# Patient Record
Sex: Male | Born: 1965 | Race: White | Hispanic: No | Marital: Single | Smoking: Current every day smoker
Health system: Southern US, Community
[De-identification: ages and names within clinical notes are randomized; demographics above are authoritative.]

## PROBLEM LIST (undated history)

## (undated) DIAGNOSIS — R569 Unspecified convulsions: Secondary | ICD-10-CM

## (undated) DIAGNOSIS — I639 Cerebral infarction, unspecified: Secondary | ICD-10-CM

## (undated) DIAGNOSIS — I219 Acute myocardial infarction, unspecified: Secondary | ICD-10-CM

## (undated) HISTORY — PX: LUNG LOBECTOMY: SHX167

---

## 1997-12-21 ENCOUNTER — Emergency Department (HOSPITAL_COMMUNITY): Admission: EM | Admit: 1997-12-21 | Discharge: 1997-12-22 | Payer: Self-pay | Admitting: Emergency Medicine

## 2002-11-27 ENCOUNTER — Emergency Department (HOSPITAL_COMMUNITY): Admission: EM | Admit: 2002-11-27 | Discharge: 2002-11-27 | Payer: Self-pay | Admitting: *Deleted

## 2002-11-29 ENCOUNTER — Encounter: Payer: Self-pay | Admitting: Emergency Medicine

## 2002-11-29 ENCOUNTER — Emergency Department (HOSPITAL_COMMUNITY): Admission: EM | Admit: 2002-11-29 | Discharge: 2002-11-29 | Payer: Self-pay | Admitting: Emergency Medicine

## 2004-06-11 ENCOUNTER — Emergency Department (HOSPITAL_COMMUNITY): Admission: EM | Admit: 2004-06-11 | Discharge: 2004-06-11 | Payer: Self-pay | Admitting: *Deleted

## 2007-10-12 ENCOUNTER — Emergency Department: Payer: Self-pay | Admitting: Emergency Medicine

## 2007-10-12 ENCOUNTER — Other Ambulatory Visit: Payer: Self-pay

## 2007-11-11 ENCOUNTER — Encounter: Payer: Self-pay | Admitting: Internal Medicine

## 2007-11-30 ENCOUNTER — Ambulatory Visit: Payer: Self-pay | Admitting: Internal Medicine

## 2007-11-30 DIAGNOSIS — Z8679 Personal history of other diseases of the circulatory system: Secondary | ICD-10-CM | POA: Insufficient documentation

## 2007-11-30 DIAGNOSIS — J984 Other disorders of lung: Secondary | ICD-10-CM

## 2007-11-30 DIAGNOSIS — E785 Hyperlipidemia, unspecified: Secondary | ICD-10-CM

## 2007-11-30 DIAGNOSIS — Z87442 Personal history of urinary calculi: Secondary | ICD-10-CM

## 2007-11-30 LAB — CONVERTED CEMR LAB
AST: 20 units/L (ref 0–37)
Cholesterol: 159 mg/dL (ref 0–200)

## 2007-12-09 ENCOUNTER — Encounter: Payer: Self-pay | Admitting: Internal Medicine

## 2007-12-10 ENCOUNTER — Encounter: Payer: Self-pay | Admitting: Family Medicine

## 2007-12-10 ENCOUNTER — Ambulatory Visit: Payer: Self-pay | Admitting: Thoracic Surgery (Cardiothoracic Vascular Surgery)

## 2007-12-10 ENCOUNTER — Encounter: Payer: Self-pay | Admitting: Internal Medicine

## 2007-12-24 ENCOUNTER — Ambulatory Visit: Payer: Self-pay | Admitting: Thoracic Surgery (Cardiothoracic Vascular Surgery)

## 2007-12-24 ENCOUNTER — Encounter: Payer: Self-pay | Admitting: Thoracic Surgery (Cardiothoracic Vascular Surgery)

## 2007-12-24 ENCOUNTER — Inpatient Hospital Stay (HOSPITAL_COMMUNITY)
Admission: RE | Admit: 2007-12-24 | Discharge: 2007-12-28 | Payer: Self-pay | Admitting: Thoracic Surgery (Cardiothoracic Vascular Surgery)

## 2008-01-11 ENCOUNTER — Encounter
Admission: RE | Admit: 2008-01-11 | Discharge: 2008-01-11 | Payer: Self-pay | Admitting: Thoracic Surgery (Cardiothoracic Vascular Surgery)

## 2008-01-11 ENCOUNTER — Ambulatory Visit: Payer: Self-pay | Admitting: Thoracic Surgery (Cardiothoracic Vascular Surgery)

## 2008-03-14 ENCOUNTER — Ambulatory Visit: Payer: Self-pay | Admitting: Internal Medicine

## 2008-03-14 DIAGNOSIS — B070 Plantar wart: Secondary | ICD-10-CM

## 2008-03-14 DIAGNOSIS — R569 Unspecified convulsions: Secondary | ICD-10-CM

## 2008-03-14 DIAGNOSIS — K219 Gastro-esophageal reflux disease without esophagitis: Secondary | ICD-10-CM | POA: Insufficient documentation

## 2008-03-14 DIAGNOSIS — F17200 Nicotine dependence, unspecified, uncomplicated: Secondary | ICD-10-CM

## 2008-03-14 LAB — CONVERTED CEMR LAB
AST: 19 units/L (ref 0–37)
Cholesterol: 146 mg/dL (ref 0–200)

## 2008-06-06 ENCOUNTER — Ambulatory Visit: Payer: Self-pay | Admitting: Internal Medicine

## 2008-06-06 DIAGNOSIS — F411 Generalized anxiety disorder: Secondary | ICD-10-CM | POA: Insufficient documentation

## 2008-10-03 ENCOUNTER — Ambulatory Visit: Payer: Self-pay | Admitting: Internal Medicine

## 2008-11-21 IMAGING — CR DG CHEST 2V
2 series · 2 of 2 positions shown · non-contrast
Comparison: None

CLINICAL DATA: Preoperative respiratory exam.  Mediastinal mass.

CHEST - 2 VIEW

[view not recorded (1 of 2)]
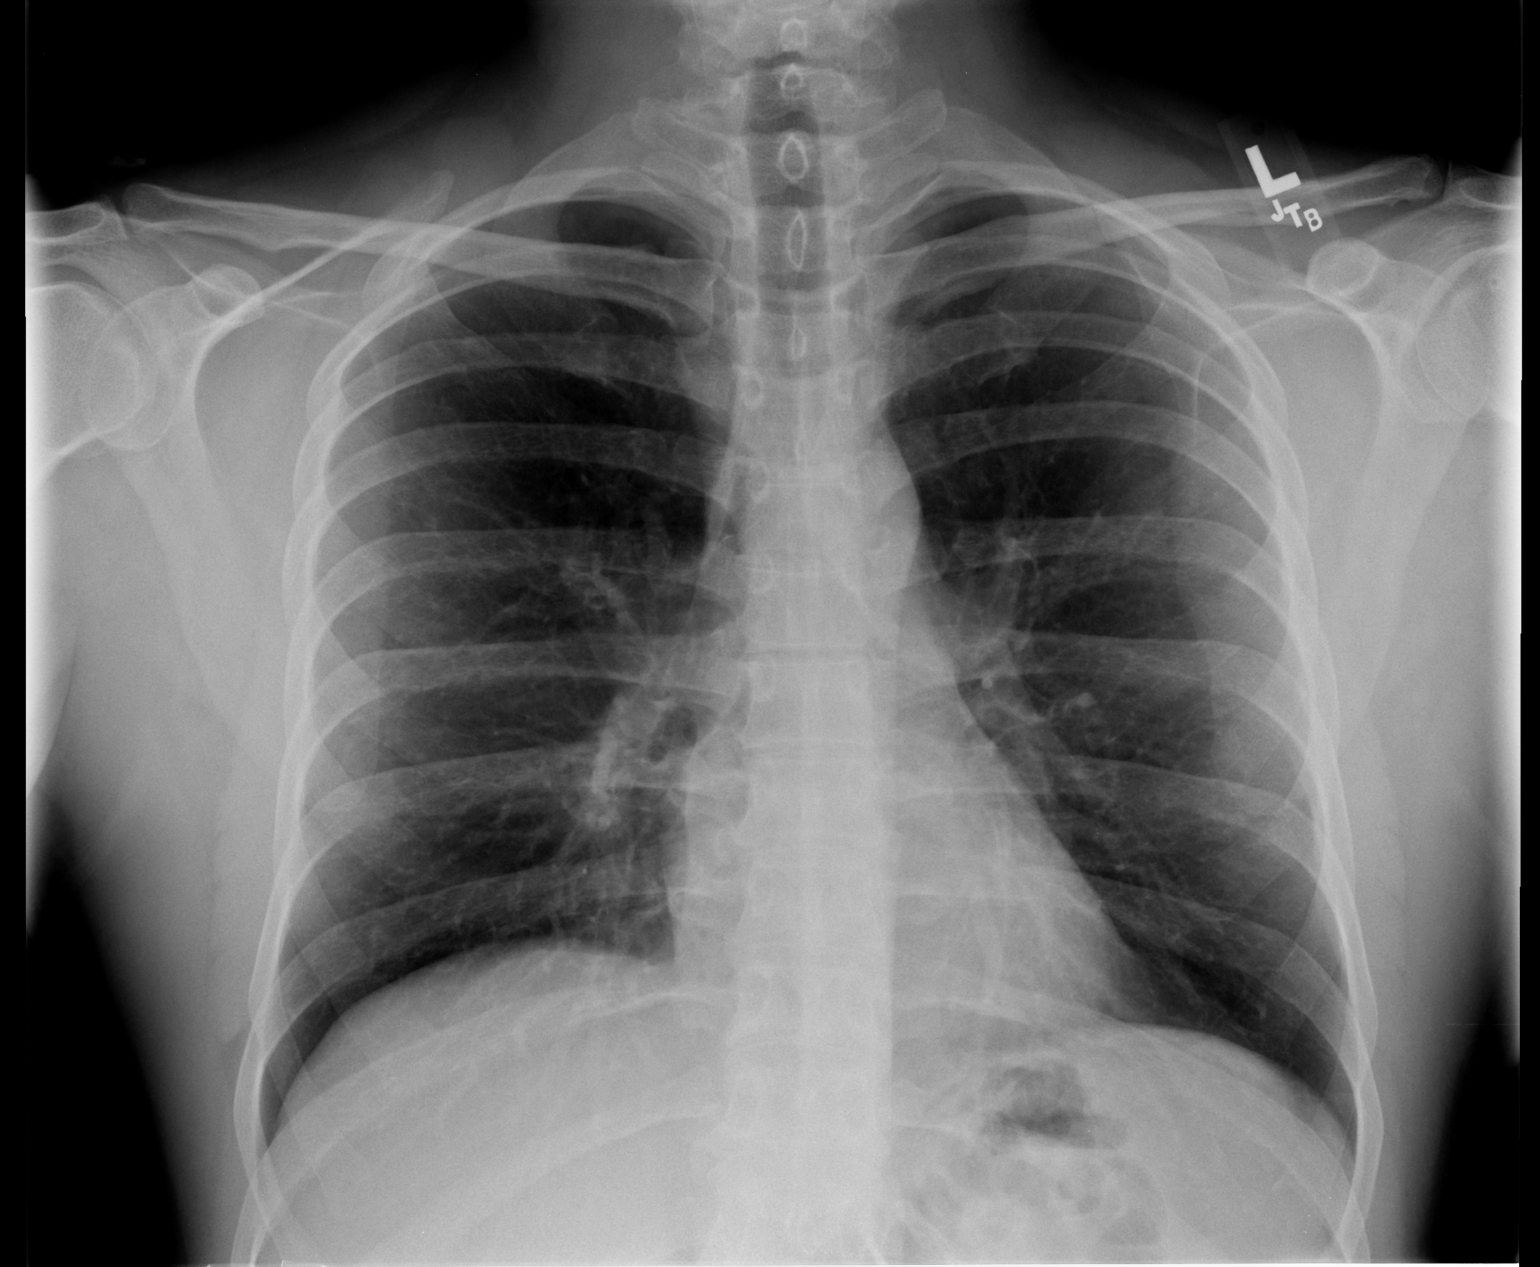

[view not recorded (2 of 2)]
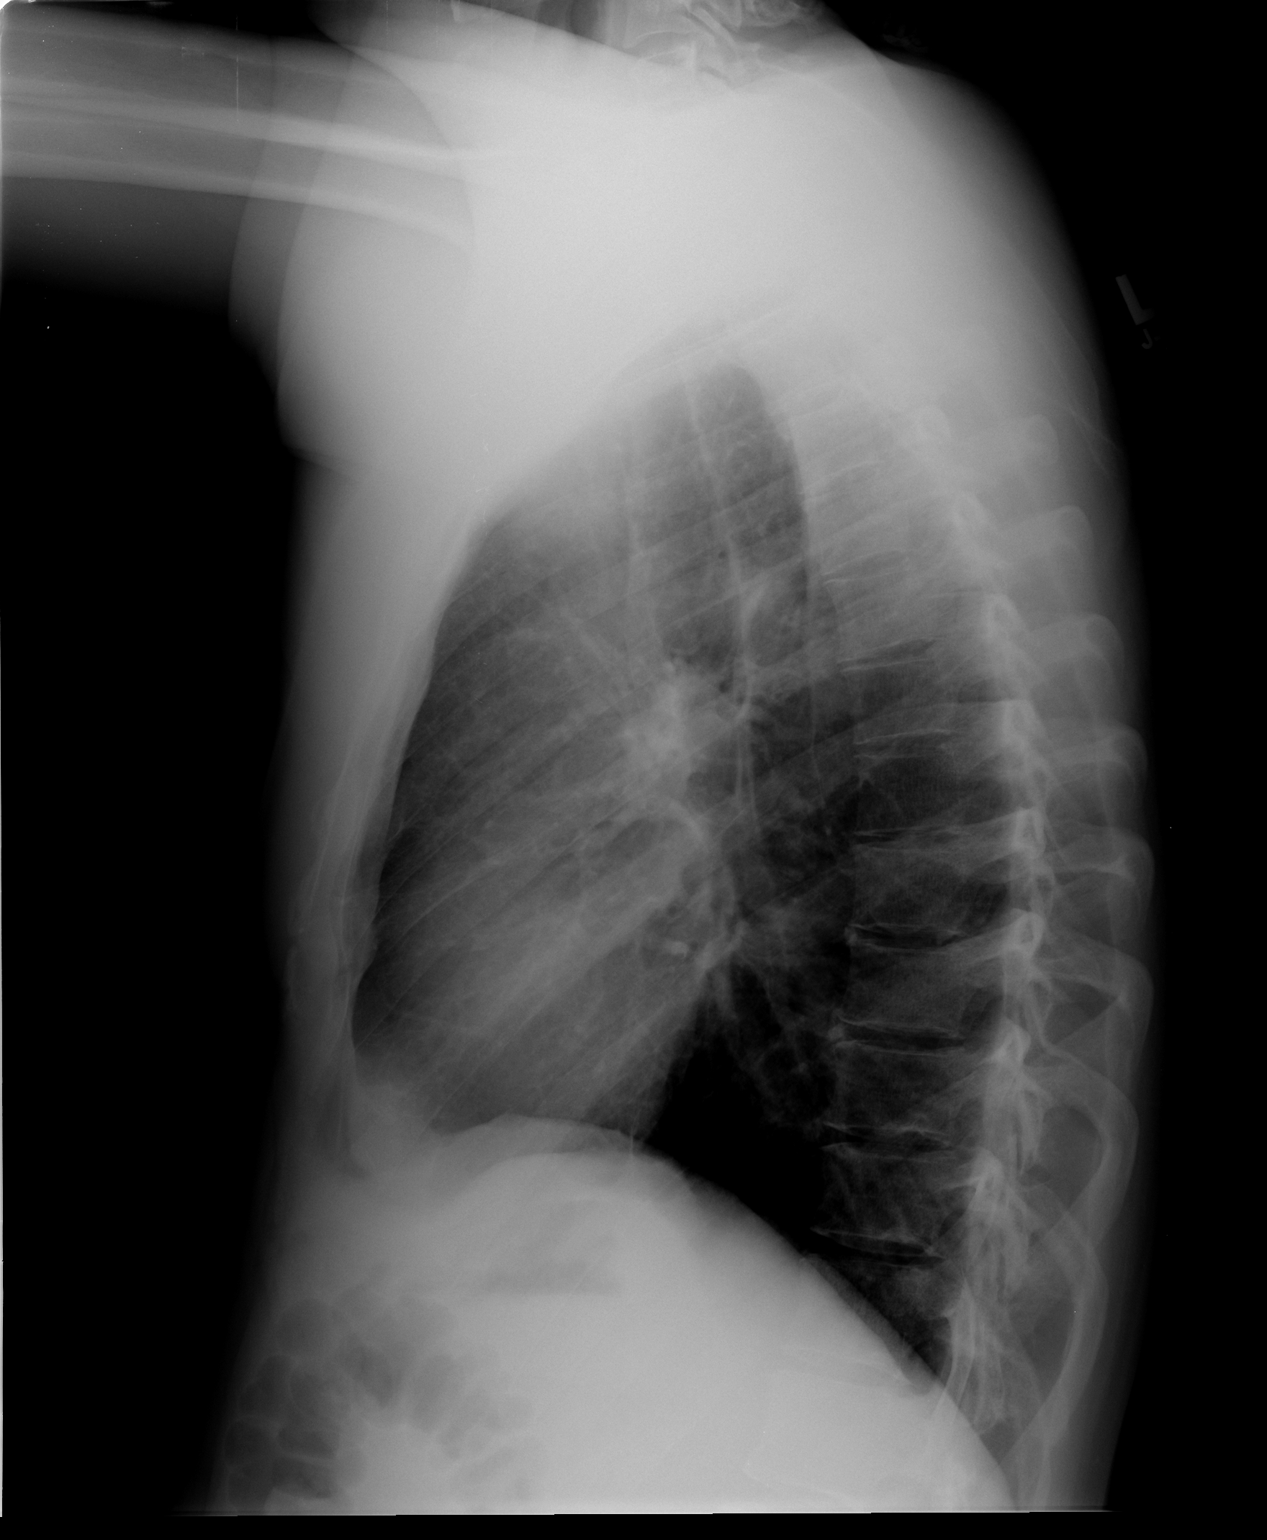

[2 of 2 positions shown; findings below may reference images not displayed]

FINDINGS: The heart size and vascularity are normal and the lungs
are clear.  No significant bony abnormality.  On the lateral view
there appear to be some tiny metallic clips or unusual
calcifications in the soft tissues anterior and adjacent to the
manubrium.  These could represent artifacts.  Has the patient had
prior surgery in that area?  There is slight increased soft tissue
density in the anterior mediastinum just deep to these densities.
Is this the site of patient's mass?
IMPRESSION: No acute abnormality.  Soft tissue density in the anterior-superior
mediastinum on the lateral view with what are probably tiny
surgical clips or unusual calcifications in that same area.

## 2008-11-23 IMAGING — CR DG CHEST 1V PORT
1 series · 1 of 1 positions shown · non-contrast
Comparison: 12/22/2007

CLINICAL DATA: Left VATS, central line placement

PORTABLE CHEST - 1 VIEW

[AP]
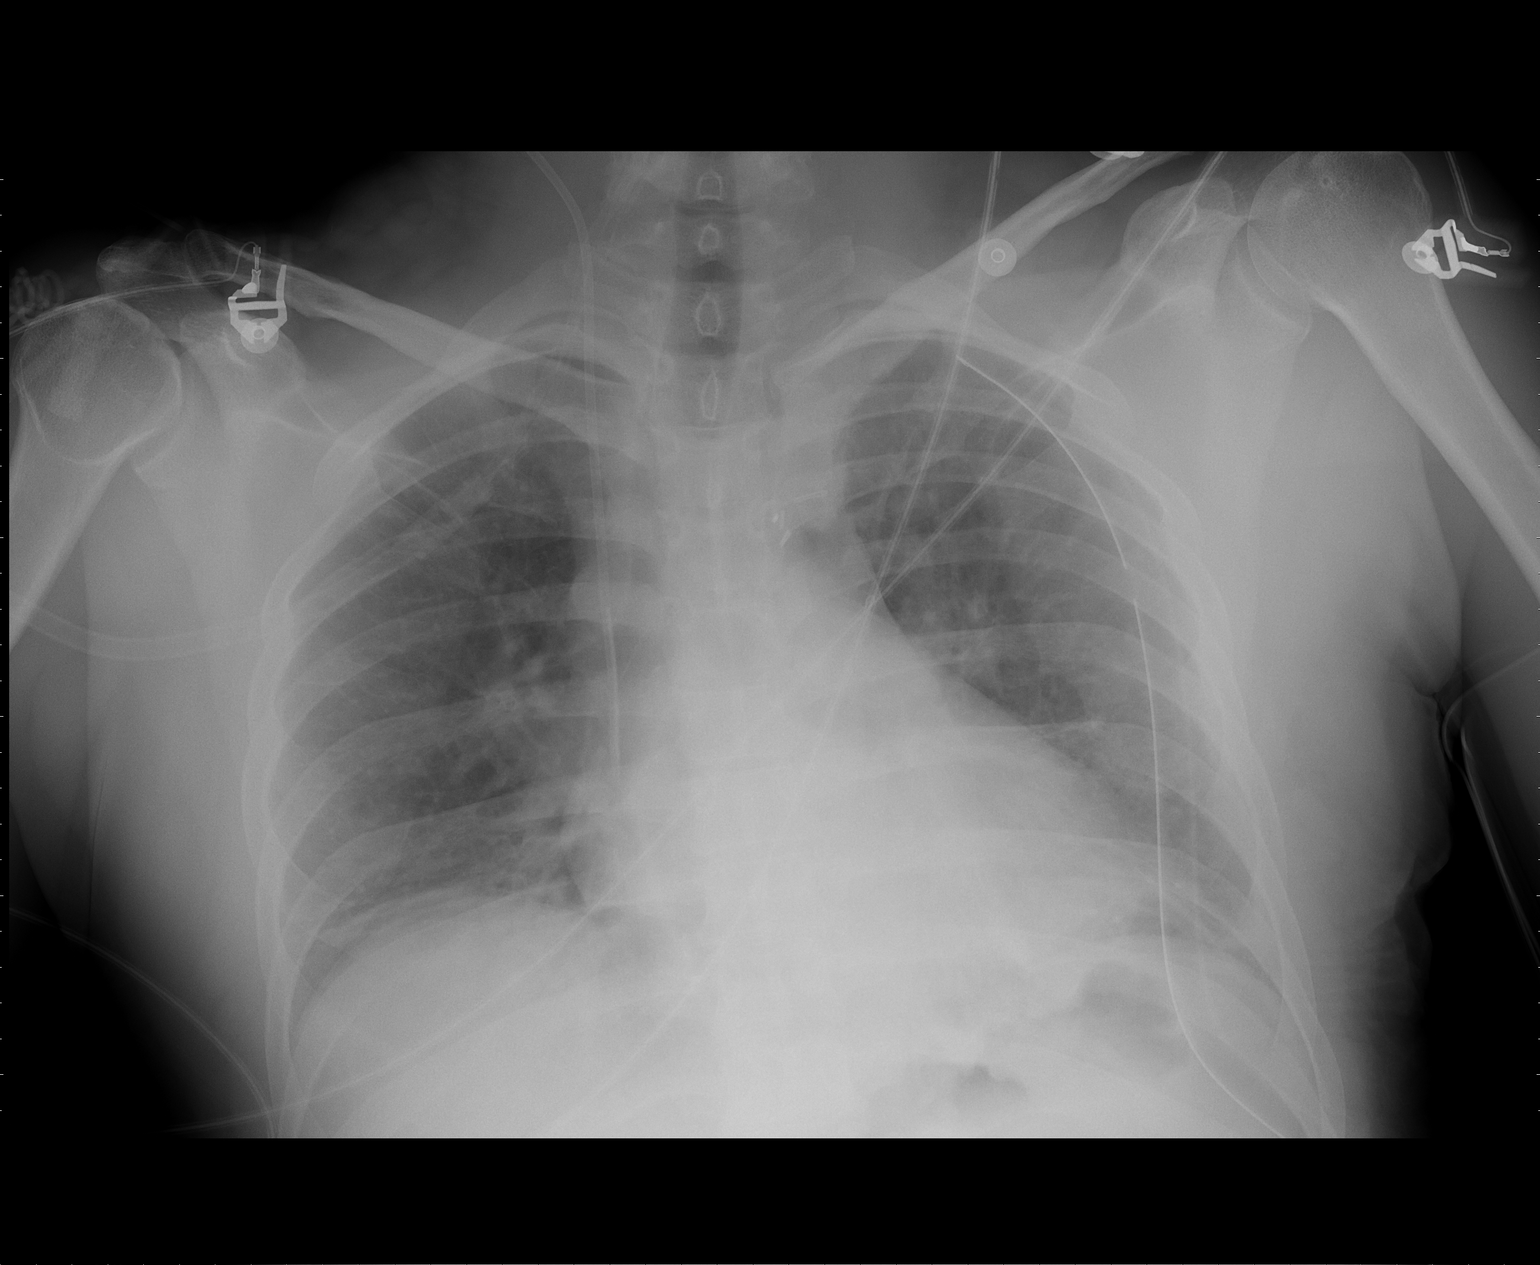

[1 of 1 positions shown; findings below may reference images not displayed]

FINDINGS: Left chest tube in place with no pneumothorax.  Right IJ
central line to the cavoatrial junction.  Mild apparent enlargement
cardiac silhouette.  Vascular clips in the left suprahilar region.
Low lung volumes with patchy subsegmental atelectasis versus early
interstitial infiltrates in the lung bases.  No effusion.
IMPRESSION: 1.  Central line and chest tube placement as above without
pneumothorax.
2.  Patchy bibasilar subsegmental atelectasis or infiltrates.

## 2010-07-23 ENCOUNTER — Encounter: Payer: Self-pay | Admitting: Thoracic Surgery (Cardiothoracic Vascular Surgery)

## 2010-11-13 NOTE — Assessment & Plan Note (Signed)
OFFICE VISIT   [name], [name]  DOB:  03-31-1966                                        January 11, 2008  CHART #:  [id number]   REASON FOR VISIT:  Followup after recent thymectomy.   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  During his workup,  he was found to have an anterior mediastinal mass.  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  There was no evidence of malignancy.  Postoperatively, he did well.  He  was discharged home after a couple of days.  He still complaining of  some incisional pain.  He was taking oxycodone initially when he left  the hospital, but subsequently was switched to Lortab and has not had  the same level of pain relief with hydrocodone as he had with oxycodone.  He has not had any difficulty breathing.  He does complain that his left  arm is locking up on him and sometimes he would drop some things.  Intermittently, his left hand will go numb.  He states that this is  worse since the surgery, but his wife states that he has had it since  his stroke.   PHYSICAL EXAMINATION:  GENERAL:  The patient is a well-appearing 42-year-  old gentleman in no acute distress.  Incisions are clean, dry and  intact.  LUNGS:  Equal breath sounds bilaterally.  EXTREMITIES:  His  left hand has good pulse, brisk capillary refill, and normal grip  strength.   IMAGING STUDIES:  Chest x-ray shows good aeration of the lungs  bilaterally.   IMPRESSION:  The patient is doing well at this point in time.  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  I gave him a  prescription for additional 40 oxycodone tablets, take 1 to 2 of those 3  times a day as needed for pain.  He can supplement with Tylenol or Advil  or use plain oxycodone rather  than Percocet, so that he can manage the  Tylenol dosage.  I did discuss with him what safe acetaminophen dosages  were.  We will plan to see him back in a year and do  a CT of the chest at that time just make sure there is no sign of  recurrence, although it is highly unlikely in this setting.   [name], M.D.  Electronically Signed   SCH/MEDQ  D:  01/11/2008  T:  01/12/2008  Job:  [id number]   cc:   Gordy Savers, MD

## 2010-11-13 NOTE — Op Note (Signed)
[name], [name]               ACCOUNT NO.:  [id number]   MEDICAL RECORD NO.:  [id number]          PATIENT TYPE:  INP   LOCATION:  3306                         FACILITY:  [facility]   PHYSICIAN:  Salvatore Decent. Dorris Fetch, M.D.DATE OF BIRTH:  1966/05/04   DATE OF PROCEDURE:  DATE OF DISCHARGE:                               OPERATIVE REPORT   PREOPERATIVE DIAGNOSIS:  Anterior mediastinal mass.   POSTOPERATIVE DIAGNOSIS:  Benign thymoma with cystic component.   PROCEDURE:  Left video-assisted thoracoscopic surgery, thoracotomy, and  thymectomy.   SURGEON:  Salvatore Decent. Dorris Fetch, MD   ASSISTANT:  Stephanie Acre Dominick, PA   ANESTHESIA:  General.   FINDINGS:  Cystic lobulated mass left anterior mediastinum, noninvasive,  frozen section revealed thymic tissue.   CLINICAL NOTE:  [name] is a 45 year old gentleman who is being  evaluated for a stroke when a CT angiogram of the neck showed an  anterior mediastinal mass.  He subsequently had a PET scan which showed  an anterior mediastinal mass and there were several small lung nodules  which were not suspicious.  The mediastinal mass was not hypermetabolic.  Alpha-fetoprotein and HCG levels were normal.  The patient was advised  to undergo surgical removal.  Option of median sternotomy or a left  video-assisted thoracoscopic approach were discussed, and the patient  opted for the left video-assisted thoracoscopy.  The indications, risks,  benefits, and alternatives were discussed in detail with the patient.  He understood and accepted the risks and agreed to proceed.   OPERATIVE NOTE:  [name] was brought to the preop holding area on  morning of December 24, 2007.  There, Anesthesia established intravenous  access.  An arterial blood pressure monitoring catheter was placed.  Intravenous antibiotics were administered.  PAS hose were placed for DVT  prophylaxis.  The patient was taken to the operating room, anesthetized,  and intubated  with a double-lumen endotracheal tube.  He was placed in a  right lateral decubitus position and the left chest was prepped and  draped in the usual sterile fashion.   Single-lung ventilation of the right lung was carried out, and the  patient tolerated this well throughout the procedure.  Incision was made  in approximately seventh intercostal space in the midaxillary line and  was carried to the skin and subcutaneous tissue.  Chest was entered  bluntly using hemostat.  The port was inserted and the thoracoscope was  placed in the chest.  A retraction port type incision was made  posteriorly.  A clamp was placed through this incision and used to  retract the lung posteriorly.  With retraction of the lung, there was a  clear mass underlying the mediastinal pleura just above the heart.  The  phrenic nerve was identified and preserved, and the traction and cautery  were avoided in the vicinity of the phrenic nerve.  A small working mini  thoracotomy was made in the anterior axillary line at approximately the  fourth intercostal space.  An additional port type incision was made  more anteriorly for retraction.  the pleura overlying the mass was  incised.  There was no invasion of the pleura, and it was seen in the  mass was cystic and well encapsulated, appeared to be a thymic mass.  Combination of blunt dissection as well as sharp dissection using  electrocautery was used to free up the mass beginning inferiorly along  the pericardium.  The mass did extend to and cross the midline on the  right side.  The superior pole on the left side then was freed up again  by incising the overlying pleura.  The undersurface of the mass then was  freed from the pericardium from the lateral aspect as well.  Dissecting  out the superior portion of the mass, the innominate vein was identified  and preserved.  Portion of the mass in the right chest was exposed with  gentle traction on the mass itself.  Care  was taken to keep the mass  intact.  The superior holes of the thymus were included with the  specimen, carefully dissected them off the innominate vein.  Larger  branches were clipped.  Smaller branches were cauterized.  There was a  single large feeding vein from the thymus into the innominate vein.  This was clipped and divided.  Specimen was removed in the Endobag and  sent for frozen section which revealed benign thymoma with cystic  component.  The resection site was carefully inspected for hemostasis.  The chest irrigated with warm normal saline.  A 28-French chest tube was  placed through a separate anterior stab wound incision, directed to the  apex.  The instrumentation was removed.  The left lung was reinflated.  The mini thoracotomy was closed with 2 layers of running 2-0 Vicryl  suture to reapproximate the muscular layers.  The remaining incisions  were closed with 2-0 Vicryl fascial sutures.  Skin was closed with  subcuticular sutures.  All sponge, needle, and sponge counts were  correct at the end of procedure.  There no intraoperative complications.  The patient was extubated in the operating room and taken to the  Postanesthetic Care Unit in good condition.      Salvatore Decent Dorris Fetch, M.D.  Electronically Signed     SCH/MEDQ  D:  12/24/2007  T:  12/25/2007  Job:  [id number]   cc:   Gordy Savers, MD

## 2010-11-13 NOTE — Discharge Summary (Signed)
[name], [name]               ACCOUNT NO.:  [id number]   MEDICAL RECORD NO.:  [id number]          PATIENT TYPE:  INP   LOCATION:  2004                         FACILITY:  [facility]   PHYSICIAN:  Salvatore Decent. Dorris Fetch, M.D.DATE OF BIRTH:  [birth year]   DATE OF ADMISSION:  12/24/2007  DATE OF DISCHARGE:                               DISCHARGE SUMMARY   FINAL DIAGNOSIS:  Mediastinal mass, frozen section positive for thymic  mass, final pathology report pending.   SECONDARY DIAGNOSES:  1. History of right middle cerebral artery infarct.  2. Epilepsy.  3. Tobacco abuse.  4. Alcohol abuse.  5. Drug abuse.   IN-HOSPITAL OPERATIONS AND PROCEDURES:  Left video-assisted  thoracoscopic surgery with mini thoracotomy and thymectomy.   THE PATIENT'S HISTORY AND PHYSICAL AND HOSPITAL COURSE:  [name] is  a 45 year old gentleman with a history of tobacco abuse as well as  alcohol and drug abuse.  He presented to [facility] with new-  onset seizures.  He was found to have a right middle cerebral artery  stroke.  The patient was treated for that.  Workup during this time  include CT angiogram of the neck which showed an anterior mediastinal  mass.  The patient denies any chest pain, shortness of breath,  difficulty swallowing.  He was seen and evaluated by Dr. Dorris Fetch.  Dr. Dorris Fetch discussed with the patient undergoing resection of this  mediastinal mass.  He discussed risks and benefits with the patient.  The patient also understanding, agreed to proceed.  Surgery was  scheduled for December 24, 2007.  For details of the patient's past medical  history and physical exam, please see dictated H&P.   The patient was taken to the operating room on December 25, 2007, where he  underwent left video-assisted thoracoscopic surgery with mini  thoracotomy, thymectomy.  The patient tolerated this procedure well and  transferred to the intensive care unit in stable condition.  Present  report  came back showing thymic mass.  Following surgery, the patient  was able to be extubated without difficulty.  He was noted to be alert  and oriented x4.  Neuro intact.  He was noted to be hemodynamically  stable.  The patient's postoperative course pretty much unremarkable.  Postop day #1, he was out of bed, ambulating well without difficulty.  Chest x-ray was stable and with no air leak noted.  Chest tube was  discontinued on postop day #1.  Followup chest x-ray showed a small left  apical pneumothorax.  We will repeat chest x-ray in a.m.  The patient  remained afebrile during this time.  He was weaned off oxygen, satting  greater than 90% on room air.  Incisions were clean, dry, and intact and  healing well.  He was out of bed, ambulating well without difficulty.  Currently, awaiting the patient's final pathology report.  Labs done  postop day #1, showed a white count 20.1, hemoglobin 13.8, hematocrit  40.5, platelet count of 280.  Sodium of 135, potassium 5.5, chloride of  106, bicarb of 24, BUN of 11 and creatinine of 0.94, glucose of  1.   The patient is tentatively ready for discharge home in the a.m., December 26, 2007, postop day #2, pending followup chest x-ray stable and  leukocytosis improving.   FOLLOWUP APPOINTMENTS:  Followup appointment has been arranged with Dr.  Dorris Fetch for January 11, 2008, at 11:45 a.m.  The patient will need to  obtain PA and chest x-ray 30 minutes prior to this appointment.   ACTIVITY:  The patient instructed no driving, he agrees to do so, no  heavy lifting over 10 pounds.  He is told to ambulate 3-4 times per day,  progress as tolerated and continue with his breathing exercises.   INCISIONAL CARE:  The patient is told to shower washing his incisions  using soap and water.  He is contact the office if he develops any  drainage or opening from any of his incision sites.   DIET:  The patient educated on diet to be low-fat, low-salt.   DISCHARGE  MEDICATIONS:  1. Pravastatin 20 mg daily.  2. Aspirin 325 mg daily.  3. Vitamin B12 500 mg daily.  4. Keppra 250 mg b.i.d.  5. Percocet 5/325, 1-2 tablets q.4-6 h. p.r.n.      Theda Belfast, PA      Salvatore Decent. Dorris Fetch, M.D.  Electronically Signed    KMD/MEDQ  D:  12/25/2007  T:  12/26/2007  Job:  [id number]

## 2010-11-13 NOTE — Consult Note (Signed)
NEW PATIENT CONSULTATION   [name], [name]  DOB:  August 04, 1965                                        December 10, 2007  CHART #:  [id number]   REASON FOR CONSULTATION:  Mediastinal mass.   HISTORY OF PRESENT ILLNESS:  [name] is a 45 year old gentleman with  a history of tobacco abuse as well as alcohol and drug abuse.  He  presented to [facility] in April with new-onset seizures.  He was  found to have a right middle cerebral artery stroke.  He was treated for  that.  Workup of that included a CT angiogram of the neck, which showed  an anterior mediastinal mass.  He states that since that time, he has  not had any frank seizures, but he does occasionally zone out usually  for several seconds at a time.  He denies any chest pain, shortness of  breath, or difficulty swallowing.  He has not noted any lumps or bumps  anywhere on his body.  No easy bruising or bleeding.  He has not had a  cough.   PAST MEDICAL HISTORY:  Significant for right middle cerebral artery  infarct and epilepsy.   CURRENT MEDICATIONS:  1. Levetiracetam 250 mg b.i.d.  2. Pravastatin 20 mg daily.  3. Aspirin 325 mg daily.  4. Vitamin B12 500 mg daily.   ALLERGIES:  He is allergic to penicillin.   FAMILY HISTORY:  Unknown per the patient.   SOCIAL HISTORY:  He is married with two children.  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  He still smokes a pack of  cigarettes daily.  He has a past history of alcohol and drug use, but no  current use of either.   REVIEW OF SYSTEMS:  Possible ongoing petit mal seizures.  No recent  grand mal seizure activity.  No residual deficit from stroke.  All other  systems are negative.   PHYSICAL EXAMINATION:  GENERAL:  [name] is a well-developed, well-  nourished 45 year old white male in no acute distress.  VITAL SIGNS:  Blood pressure is 138/85, pulse 84, respirations are 20,  and his oxygen saturation is 97% on room air.   He is very stoic.  NEUROLOGICAL:  He is alert and oriented x3 with no focal deficits.  HEENT:  Unremarkable.  NECK:  Supple without thyromegaly, adenopathy, or bruits.  CARDIAC:  Regular rate and rhythm.  Normal S1 and S2.  No rubs, murmurs,  or gallops.  LUNGS:  Clear with equal breath sounds bilaterally.  There is no  cervical or supraclavicular adenopathy.  ABDOMEN:  Soft and nontender.  EXTREMITIES:  Without clubbing, cyanosis, or edema.   CT of the chest was reviewed as was the PET images.  There is an  anterior mediastinal mass.  There are multiple small lung nodules as  well.  There is a slight focus of hyperactivity in the anterior  mediastinal mass.  Majority of this lesion is not hypermetabolic.   IMPRESSION:  [name] is a 45 year old white male with anterior  mediastinal mass and multiple small lung nodules.  There are three  nodules, all of which are less than a centimeter.  They are too small to  characterize by PET, but none appear particularly suspicious.  Of more  concern is the anterior mediastinal  mass.  Differential diagnosis  includes thymoma, benign or malignant, teratoma, germ cell tumor or  lymphoma.  This has a rather benign appearance.  There is no evidence of  local invasion and it is predominantly left sided.  I discussed with the  patient the option of continuing to observe this with CT scans.  I was  not in favor of that option, but did offer it is potential versus  surgical removal.  With surgical removal, we offered the relative  advantages and disadvantages of two approaches, one being a median  sternotomy and the second being a video-assisted approach through the  left chest.  As this is predominantly left-sided, I do believe it could  be removed in his entirety through the left chest.  I did discuss the  disadvantage that if this  were malignant, it maybe more difficult to  remove the entire tumor or maybe even impossible to remove the entire   lesion through the left chest approach.  However, again given the CT  appearance, it does appear that it would be amenable to removal from  that approach with potential for quicker recovery and less postoperative  discomfort.  He does need alpha-fetoprotein and beta hCG checked and  will have those drawn today.  We will tentatively plan for a left VATS  excision of mediastinal mass on Thursday, December 24, 2007.  The patient  will be admitted on the day of surgery.  We did discuss the indications,  risks, benefits, and alternatives.  He understands the risks include,  but are not limited to death, stroke, myocardial infarction, deep vein  thrombosis, pulmonary embolism, bleeding, possible need for  transfusions, infections as  well as other organ system dysfunction.  He understands and accepts  these risks and agrees to proceed.   Salvatore Decent Dorris Fetch, M.D.  Electronically Signed   SCH/MEDQ  D:  12/10/2007  T:  12/10/2007  Job:  [id number]   cc:   Gordy Savers, MD

## 2011-03-28 LAB — TYPE AND SCREEN: Antibody Screen: NEGATIVE

## 2011-03-28 LAB — BASIC METABOLIC PANEL
CO2: 24
Calcium: 8.5
Creatinine, Ser: 0.94
Glucose, Bld: 89

## 2011-03-28 LAB — CBC
HCT: 36.7 — ABNORMAL LOW
HCT: 43.7
Hemoglobin: 15.1
MCHC: 33.6
MCHC: 34
MCHC: 34.7
MCV: 84.9
Platelets: 228
RBC: 5.14
RDW: 13.8
RDW: 13.9
RDW: 14.1

## 2011-03-28 LAB — POCT I-STAT 7, (LYTES, BLD GAS, ICA,H+H)
Calcium, Ion: 1.08 — ABNORMAL LOW
HCT: 39
O2 Saturation: 100
Potassium: 3.8
Sodium: 136

## 2011-03-28 LAB — URINALYSIS, ROUTINE W REFLEX MICROSCOPIC
Bilirubin Urine: NEGATIVE
Nitrite: NEGATIVE
Specific Gravity, Urine: 1.036 — ABNORMAL HIGH
pH: 5.5

## 2011-03-28 LAB — COMPREHENSIVE METABOLIC PANEL
ALT: 24
AST: 19
Albumin: 3.2 — ABNORMAL LOW
Albumin: 3.8
Alkaline Phosphatase: 60
Alkaline Phosphatase: 77
BUN: 16
BUN: 5 — ABNORMAL LOW
CO2: 20
Calcium: 8.4
Glucose, Bld: 101 — ABNORMAL HIGH
Potassium: 3.7
Total Bilirubin: 0.5
Total Protein: 5.7 — ABNORMAL LOW

## 2011-03-28 LAB — BLOOD GAS, ARTERIAL
Acid-base deficit: 1.8
Acid-base deficit: 2.2 — ABNORMAL HIGH
Bicarbonate: 22.1
Bicarbonate: 24.2 — ABNORMAL HIGH
Drawn by: 305751
FIO2: 0.21
FIO2: 0.28
O2 Saturation: 93.4
O2 Saturation: 97.5
Patient temperature: 98.6
Patient temperature: 98.6
TCO2: 23.2
pO2, Arterial: 92.6

## 2011-03-28 LAB — APTT: aPTT: 26

## 2011-03-28 LAB — ABO/RH: ABO/RH(D): AB POS

## 2012-04-15 ENCOUNTER — Emergency Department (HOSPITAL_COMMUNITY)
Admission: EM | Admit: 2012-04-15 | Discharge: 2012-04-15 | Disposition: A | Discharge status: Home / Self Care | Payer: Self-pay | Attending: Emergency Medicine | Admitting: Emergency Medicine

## 2012-04-15 ENCOUNTER — Encounter (HOSPITAL_COMMUNITY): Payer: Self-pay | Admitting: *Deleted

## 2012-04-15 DIAGNOSIS — IMO0002 Reserved for concepts with insufficient information to code with codable children: Secondary | ICD-10-CM

## 2012-04-15 DIAGNOSIS — S51809A Unspecified open wound of unspecified forearm, initial encounter: Secondary | ICD-10-CM | POA: Insufficient documentation

## 2012-04-15 HISTORY — DX: Unspecified convulsions: R56.9

## 2012-04-15 HISTORY — DX: Acute myocardial infarction, unspecified: I21.9

## 2012-04-15 MED ORDER — TETANUS-DIPHTH-ACELL PERTUSSIS 5-2.5-18.5 LF-MCG/0.5 IM SUSP
0.5000 mL | Freq: Once | INTRAMUSCULAR | Status: AC
  Administered 2012-04-15: 0.5 mL via INTRAMUSCULAR
  Filled 2012-04-15: qty 0.5

## 2012-04-15 MED ORDER — CEPHALEXIN 500 MG PO CAPS: 500.0000 mg | ORAL_CAPSULE | Freq: Two times a day (BID) | ORAL | Status: DC
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
## 2012-04-15 NOTE — ED Notes (Signed)
MD at bedside.  Cammy Copa EDPA

## 2012-04-15 NOTE — ED Notes (Signed)
Signature pad not working. 
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
## 2012-05-10 NOTE — ED Provider Notes (Signed)
Medical screening examination/treatment/procedure(s) were performed by non-physician practitioner and as supervising physician I was immediately available for consultation/collaboration.   Rolan Bucco, MD 05/10/12 [phone]

## 2018-05-07 ENCOUNTER — Emergency Department (HOSPITAL_COMMUNITY)
Admission: EM | Admit: 2018-05-07 | Discharge: 2018-05-07 | Disposition: A | Discharge status: Home / Self Care | Payer: Self-pay | Attending: Emergency Medicine | Admitting: Emergency Medicine

## 2018-05-07 ENCOUNTER — Other Ambulatory Visit: Payer: Self-pay

## 2018-05-07 ENCOUNTER — Emergency Department (HOSPITAL_COMMUNITY): Payer: Self-pay

## 2018-05-07 ENCOUNTER — Encounter (HOSPITAL_COMMUNITY): Payer: Self-pay | Admitting: Emergency Medicine

## 2018-05-07 DIAGNOSIS — F419 Anxiety disorder, unspecified: Secondary | ICD-10-CM | POA: Insufficient documentation

## 2018-05-07 DIAGNOSIS — I252 Old myocardial infarction: Secondary | ICD-10-CM | POA: Insufficient documentation

## 2018-05-07 DIAGNOSIS — Y998 Other external cause status: Secondary | ICD-10-CM | POA: Insufficient documentation

## 2018-05-07 DIAGNOSIS — Y9389 Activity, other specified: Secondary | ICD-10-CM | POA: Insufficient documentation

## 2018-05-07 DIAGNOSIS — S39011A Strain of muscle, fascia and tendon of abdomen, initial encounter: Secondary | ICD-10-CM | POA: Insufficient documentation

## 2018-05-07 DIAGNOSIS — Y929 Unspecified place or not applicable: Secondary | ICD-10-CM | POA: Insufficient documentation

## 2018-05-07 DIAGNOSIS — K862 Cyst of pancreas: Secondary | ICD-10-CM | POA: Insufficient documentation

## 2018-05-07 DIAGNOSIS — F17210 Nicotine dependence, cigarettes, uncomplicated: Secondary | ICD-10-CM | POA: Insufficient documentation

## 2018-05-07 DIAGNOSIS — X58XXXA Exposure to other specified factors, initial encounter: Secondary | ICD-10-CM | POA: Insufficient documentation

## 2018-05-07 LAB — CBC
HCT: 45.9 % (ref 39.0–52.0)
HEMOGLOBIN: 14.4 g/dL (ref 13.0–17.0)
MCH: 27.2 pg (ref 26.0–34.0)
MCHC: 31.4 g/dL (ref 30.0–36.0)
MCV: 86.6 fL (ref 80.0–100.0)
Platelets: 361 10*3/uL (ref 150–400)
RBC: 5.3 MIL/uL (ref 4.22–5.81)
RDW: 13.8 % (ref 11.5–15.5)
WBC: 9.9 10*3/uL (ref 4.0–10.5)
nRBC: 0 % (ref 0.0–0.2)

## 2018-05-07 LAB — COMPREHENSIVE METABOLIC PANEL
ALT: 61 U/L — ABNORMAL HIGH (ref 0–44)
ANION GAP: 8 (ref 5–15)
AST: 35 U/L (ref 15–41)
Albumin: 3.8 g/dL (ref 3.5–5.0)
Alkaline Phosphatase: 76 U/L (ref 38–126)
BUN: 7 mg/dL (ref 6–20)
CO2: 25 mmol/L (ref 22–32)
Calcium: 9 mg/dL (ref 8.9–10.3)
Chloride: 102 mmol/L (ref 98–111)
Creatinine, Ser: 1.09 mg/dL (ref 0.61–1.24)
GFR calc Af Amer: >60 mL/min (ref >60)
GFR calc non Af Amer: >60 mL/min (ref >60)
Glucose, Bld: 102 mg/dL — ABNORMAL HIGH (ref 70–99)
POTASSIUM: 3.9 mmol/L (ref 3.5–5.1)
Sodium: 135 mmol/L (ref 135–145)
TOTAL PROTEIN: 7 g/dL (ref 6.5–8.1)
Total Bilirubin: 0.6 mg/dL (ref 0.3–1.2)

## 2018-05-07 LAB — LIPASE, BLOOD: LIPASE: 49 U/L (ref 11–51)

## 2018-05-07 MED ORDER — IBUPROFEN 600 MG PO TABS: 600.0000 mg | ORAL_TABLET | Freq: Three times a day (TID) | ORAL | 0 refills | Status: AC

## 2018-05-07 MED ORDER — IOHEXOL 300 MG/ML  SOLN
100.0000 mL | Freq: Once | INTRAMUSCULAR | Status: AC | PRN
  Administered 2018-05-07: 100 mL via INTRAVENOUS
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
## 2018-05-07 NOTE — ED Notes (Signed)
Pt stable, ambulatory, states understanding of discharge instructions 

## 2018-05-07 NOTE — ED Provider Notes (Signed)
[facility] EMERGENCY DEPARTMENT Provider Note   CSN: [id number] Arrival date & time: 05/07/18  1049     History   Chief Complaint Chief Complaint  Patient presents with  . Abdominal Pain    HPI [name] is a 52 y.o. male.  52 year old male with past medical history including CVA, hyperlipidemia, seizure, anxiety, left lung lobectomy who presents with upper abdominal pain.  The patient has had almost 2 weeks of persistent left upper abdominal pain that is worse with any movements especially with lifting anything.  He has had a hard time moving around recently because of the pain.  Pain is not associated with eating.  He reports normal appetite, no vomiting, diarrhea, or constipation.  No urinary problems.  He reports cough productive of mucus but no sore throat, runny nose, fevers, breathing problems, or sick contacts.  He denies any trauma or change in his physical activity recently prior to onset of pain.  He has been incarcerated for a while and was just recently released.  He does not follow with a doctor or take any medications.  The history is provided by the patient.  Abdominal Pain      Past Medical History:  Diagnosis Date  . Myocardial infarction (HCC)   . Seizures [facility])     Patient Active Problem List   Diagnosis Date Noted  . ANXIETY DISORDER 06/06/2008  . 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 TOBACCO ABUSE 03/14/2008  . GERD 03/14/2008  . SEIZURE DISORDER 03/14/2008  . HYPERLIPIDEMIA 11/30/2007  . LUNG NODULE 11/30/2007  . CEREBROVASCULAR ACCIDENT, HX OF 11/30/2007  . 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 LUNG LOBECTOMY     L        Home Medications    Prior to Admission medications   Medication Sig Start Date End Date Taking? Authorizing Provider  cephALEXin (KEFLEX) 500 MG capsule Take 1 capsule (500 mg total) by mouth 2 (two) times daily. 04/15/12   Harris, Cammy Copa, PA-C    ibuprofen (ADVIL,MOTRIN) 600 MG tablet Take 1 tablet (600 mg total) by mouth 3 (three) times daily for 4 days. 05/07/18 05/11/18  Little, Ambrose Finland, MD    Family History No family history on file.  Social History Social History   Tobacco Use  . Smoking status: Current Every Day Smoker    Packs/day: 1.00    Types: Cigarettes  Substance Use Topics  . Alcohol use: Yes    Alcohol/week: 12.0 standard drinks    Types: 12 Cans of beer per week  . Drug use: No     Allergies   Penicillins   Review of Systems Review of Systems  Gastrointestinal: Positive for abdominal pain.   All other systems reviewed and are negative except that which was mentioned in HPI   Physical Exam Updated Vital Signs BP (!) 162/100   Pulse 79   Temp 98.1 F (36.7 C) (Oral)   Resp 16   SpO2 100%   Physical Exam  Constitutional: He is oriented to person, place, and time. He appears well-developed and well-nourished. No distress.  HENT:  Head: Normocephalic and atraumatic.  Moist mucous membranes  Eyes: Conjunctivae are normal.  Neck: Neck supple.  Cardiovascular: Normal rate, regular rhythm and normal heart sounds.  No murmur heard. Pulmonary/Chest: Effort normal and breath sounds normal.  Abdominal: Soft. Bowel sounds are normal. He exhibits no distension. There is tenderness in the left upper quadrant. There  is guarding. There is no rebound.  LUQ tenderness with mild firmness and voluntary guarding in this area, no lower abd tenderness; no abd scars  Musculoskeletal: He exhibits no edema.  Neurological: He is alert and oriented to person, place, and time.  Fluent speech  Skin: Skin is warm and dry.  Psychiatric: He has a normal mood and affect. Judgment normal.  Nursing note and vitals reviewed.    ED Treatments / Results  Labs (all labs ordered are listed, but only abnormal results are displayed) Labs Reviewed  COMPREHENSIVE METABOLIC PANEL - Abnormal; Notable for the following  components:      Result Value   Glucose, Bld 102 (*)    ALT 61 (*)    All other components within normal limits  CBC  LIPASE, BLOOD    EKG None  Radiology Dg Chest 2 View  Result Date: 05/07/2018 CLINICAL DATA:  Lower left chest pain today. EXAM: CHEST - 2 VIEW COMPARISON:  PA and lateral chest 01/11/2008. FINDINGS: Lungs clear. Heart size normal. Surgical clips anterior to the ascending aorta noted. No pneumothorax or pleural fluid. No bony abnormality. IMPRESSION: Negative chest. Electronically Signed   By: Drusilla Kanner M.D.   On: 05/07/2018 11:27   Ct Abdomen Pelvis W Contrast  Result Date: 05/07/2018 CLINICAL DATA:  52 y/o  M; left upper abdominal pain. EXAM: CT ABDOMEN AND PELVIS WITH CONTRAST TECHNIQUE: Multidetector CT imaging of the abdomen and pelvis was performed using the standard protocol following bolus administration of intravenous contrast. CONTRAST:  OMNIPAQUE IOHEXOL 300 MG/ML  SOLN COMPARISON:  None. FINDINGS: Lower chest: No acute abnormality. Hepatobiliary: No focal liver abnormality is seen. No gallstones, gallbladder wall thickening, or biliary dilatation. 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 No inflammation or main duct dilatation. Spleen: Normal in size without focal abnormality. Adrenals/Urinary Tract: Adrenal glands are unremarkable. Kidneys are normal, without renal calculi, focal lesion, or hydronephrosis. Bladder is unremarkable. Stomach/Bowel: Stomach is within normal limits. Appendix appears normal. No evidence of bowel wall thickening, distention, or inflammatory changes. Vascular/Lymphatic: No significant vascular findings are present. No enlarged abdominal or pelvic lymph nodes. Reproductive: Prostate is unremarkable. Other: No abdominal wall hernia or abnormality. No abdominopelvic ascites. Musculoskeletal: No fracture is seen. Mild multilevel degenerative changes of the spine.  IMPRESSION: 1. No acute process identified. 2. 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 One year follow-up with pancreas protocol CT or MRI recommended. This recommendation follows ACR consensus guidelines: Management of Incidental Pancreatic Cysts: A White Paper of the ACR Incidental Findings Committee. J Am Coll Radiol 2017;14:911-923. Electronically Signed   By: Mitzi Hansen M.D.   On: 05/07/2018 14:01    Procedures Procedures (including critical care time)  Medications Ordered in ED Medications  iohexol (OMNIPAQUE) 300 MG/ML solution 100 mL (100 mLs Intravenous Contrast Given 05/07/18 1336)     Initial Impression / Assessment and Plan / ED Course  I have reviewed the triage vital signs and the nursing notes.  Pertinent labs & imaging results that were available during my care of the patient were reviewed by me and considered in my medical decision making (see chart for details).     Well appearing on exam. I noted hx of L lobectomy with surgical clips on CXR, otherwise clear XR. Obtained CT to r/o incarcerated hernia or splenic process.   Work unremarkable.  CT negative for acute  process to explain his symptoms.  Incidental finding of pancreatic cyst with recommendation for repeat imaging in 1 year.  Discussed this finding with the patient and emphasized his need for follow-up with a PCP for this repeat study.  Gust supportive measures for likely abdominal wall strain.  Extensively reviewed return precautions and he voiced understanding.  Final Clinical Impressions(s) / ED Diagnoses   Final diagnoses:  Strain of abdominal wall, initial encounter  Pancreatic cyst    ED Discharge Orders         Ordered    ibuprofen (ADVIL,MOTRIN) 600 MG tablet  3 times daily     05/07/18 1420           Little, Ambrose Finland, MD 05/07/18 1528

## 2018-07-17 ENCOUNTER — Encounter (HOSPITAL_COMMUNITY): Payer: Self-pay

## 2018-07-17 ENCOUNTER — Emergency Department (HOSPITAL_COMMUNITY): Payer: No Typology Code available for payment source

## 2018-07-17 ENCOUNTER — Emergency Department (HOSPITAL_COMMUNITY)
Admission: EM | Admit: 2018-07-17 | Discharge: 2018-07-18 | Disposition: A | Discharge status: Home / Self Care | Payer: No Typology Code available for payment source | Attending: Emergency Medicine | Admitting: Emergency Medicine

## 2018-07-17 ENCOUNTER — Other Ambulatory Visit: Payer: Self-pay

## 2018-07-17 DIAGNOSIS — R079 Chest pain, unspecified: Secondary | ICD-10-CM | POA: Insufficient documentation

## 2018-07-17 DIAGNOSIS — T07XXXA Unspecified multiple injuries, initial encounter: Secondary | ICD-10-CM

## 2018-07-17 DIAGNOSIS — Y92410 Unspecified street and highway as the place of occurrence of the external cause: Secondary | ICD-10-CM | POA: Diagnosis not present

## 2018-07-17 DIAGNOSIS — I252 Old myocardial infarction: Secondary | ICD-10-CM | POA: Diagnosis not present

## 2018-07-17 DIAGNOSIS — S301XXA Contusion of abdominal wall, initial encounter: Secondary | ICD-10-CM | POA: Insufficient documentation

## 2018-07-17 DIAGNOSIS — Y998 Other external cause status: Secondary | ICD-10-CM | POA: Diagnosis not present

## 2018-07-17 DIAGNOSIS — F17210 Nicotine dependence, cigarettes, uncomplicated: Secondary | ICD-10-CM | POA: Insufficient documentation

## 2018-07-17 DIAGNOSIS — Y9389 Activity, other specified: Secondary | ICD-10-CM | POA: Diagnosis not present

## 2018-07-17 DIAGNOSIS — S3991XA Unspecified injury of abdomen, initial encounter: Secondary | ICD-10-CM | POA: Diagnosis present

## 2018-07-17 DIAGNOSIS — S30811A Abrasion of abdominal wall, initial encounter: Secondary | ICD-10-CM | POA: Insufficient documentation
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
## 2018-07-18 ENCOUNTER — Emergency Department (HOSPITAL_COMMUNITY): Payer: No Typology Code available for payment source

## 2018-07-18 LAB — CBC WITH DIFFERENTIAL/PLATELET
Abs Immature Granulocytes: 0.03 10*3/uL (ref 0.00–0.07)
BASOS ABS: 0.1 10*3/uL (ref 0.0–0.1)
Basophils Relative: 1 %
Eosinophils Absolute: 0.2 10*3/uL (ref 0.0–0.5)
Eosinophils Relative: 1 %
HCT: 47.6 % (ref 39.0–52.0)
Hemoglobin: 15.2 g/dL (ref 13.0–17.0)
IMMATURE GRANULOCYTES: 0 %
Lymphocytes Relative: 15 %
Lymphs Abs: 1.8 10*3/uL (ref 0.7–4.0)
MCH: 27.7 pg (ref 26.0–34.0)
MCHC: 31.9 g/dL (ref 30.0–36.0)
MCV: 86.7 fL (ref 80.0–100.0)
Monocytes Absolute: 1.1 10*3/uL — ABNORMAL HIGH (ref 0.1–1.0)
Monocytes Relative: 9 %
NEUTROS PCT: 74 %
NRBC: 0 % (ref 0.0–0.2)
Neutro Abs: 9.1 10*3/uL — ABNORMAL HIGH (ref 1.7–7.7)
PLATELETS: 290 10*3/uL (ref 150–400)
RBC: 5.49 MIL/uL (ref 4.22–5.81)
RDW: 13.9 % (ref 11.5–15.5)
WBC: 12.3 10*3/uL — AB (ref 4.0–10.5)

## 2018-07-18 LAB — COMPREHENSIVE METABOLIC PANEL
ALBUMIN: 4.2 g/dL (ref 3.5–5.0)
ALT: 30 U/L (ref 0–44)
AST: 26 U/L (ref 15–41)
Alkaline Phosphatase: 69 U/L (ref 38–126)
Anion gap: 14 (ref 5–15)
BUN: 11 mg/dL (ref 6–20)
CHLORIDE: 104 mmol/L (ref 98–111)
CO2: 20 mmol/L — AB (ref 22–32)
Calcium: 9.3 mg/dL (ref 8.9–10.3)
Creatinine, Ser: 1.16 mg/dL (ref 0.61–1.24)
GFR calc non Af Amer: >60 mL/min (ref >60)
Glucose, Bld: 136 mg/dL — ABNORMAL HIGH (ref 70–99)
Potassium: 3.6 mmol/L (ref 3.5–5.1)
SODIUM: 138 mmol/L (ref 135–145)
Total Bilirubin: 0.5 mg/dL (ref 0.3–1.2)
Total Protein: 7 g/dL (ref 6.5–8.1)

## 2018-07-18 LAB — URINALYSIS, ROUTINE W REFLEX MICROSCOPIC
Bilirubin Urine: NEGATIVE
GLUCOSE, UA: NEGATIVE mg/dL
Hgb urine dipstick: NEGATIVE
KETONES UR: NEGATIVE mg/dL
Leukocytes, UA: NEGATIVE
NITRITE: NEGATIVE
PROTEIN: NEGATIVE mg/dL
pH: 5 (ref 5.0–8.0)

## 2018-07-18 MED ORDER — SODIUM CHLORIDE 0.9 % IV BOLUS (SEPSIS)
1000.0000 mL | Freq: Once | INTRAVENOUS | Status: AC
  Administered 2018-07-18: 1000 mL via INTRAVENOUS

## 2018-07-18 MED ORDER — IOHEXOL 300 MG/ML  SOLN
100.0000 mL | Freq: Once | INTRAMUSCULAR | Status: AC | PRN
  Administered 2018-07-18: 100 mL via INTRAVENOUS

## 2018-07-18 MED ORDER — HYDROMORPHONE HCL 1 MG/ML IJ SOLN
1.0000 mg | Freq: Once | INTRAMUSCULAR | Status: AC
  Administered 2018-07-18: 1 mg via INTRAVENOUS
  Filled 2018-07-18: qty 1

## 2018-07-18 MED ORDER — ONDANSETRON HCL 4 MG/2ML IJ SOLN
4.0000 mg | Freq: Once | INTRAMUSCULAR | Status: AC
  Administered 2018-07-18: 4 mg via INTRAVENOUS
  Filled 2018-07-18: qty 2

## 2018-07-18 NOTE — ED Notes (Signed)
I called for patient there was no answer.
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
## 2018-07-18 NOTE — ED Notes (Signed)
Pt in room, was visiting ICU pt upstairs.

## 2018-12-24 ENCOUNTER — Emergency Department: Payer: Self-pay

## 2018-12-24 DIAGNOSIS — Y929 Unspecified place or not applicable: Secondary | ICD-10-CM | POA: Insufficient documentation

## 2018-12-24 DIAGNOSIS — W540XXA Bitten by dog, initial encounter: Secondary | ICD-10-CM | POA: Insufficient documentation

## 2018-12-24 DIAGNOSIS — Y939 Activity, unspecified: Secondary | ICD-10-CM | POA: Insufficient documentation

## 2018-12-24 DIAGNOSIS — S51852A Open bite of left forearm, initial encounter: Secondary | ICD-10-CM | POA: Insufficient documentation

## 2018-12-24 DIAGNOSIS — F17210 Nicotine dependence, cigarettes, uncomplicated: Secondary | ICD-10-CM | POA: Insufficient documentation

## 2018-12-24 DIAGNOSIS — Y999 Unspecified external cause status: Secondary | ICD-10-CM | POA: Insufficient documentation

## 2018-12-24 DIAGNOSIS — S61452A Open bite of left hand, initial encounter: Secondary | ICD-10-CM | POA: Insufficient documentation
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
## 2018-12-25 ENCOUNTER — Emergency Department
Admission: EM | Admit: 2018-12-25 | Discharge: 2018-12-25 | Disposition: A | Discharge status: Home / Self Care | Payer: Self-pay | Attending: Emergency Medicine | Admitting: Emergency Medicine

## 2018-12-25 DIAGNOSIS — S61452A Open bite of left hand, initial encounter: Secondary | ICD-10-CM

## 2018-12-25 HISTORY — DX: Cerebral infarction, unspecified: I63.9

## 2018-12-25 MED ORDER — LIDOCAINE-EPINEPHRINE-TETRACAINE (LET) SOLUTION
3.0000 mL | Freq: Once | NASAL | Status: AC
  Administered 2018-12-25: 3 mL via TOPICAL
  Filled 2018-12-25: qty 3

## 2018-12-25 MED ORDER — DOXYCYCLINE HYCLATE 100 MG PO TABS
100.0000 mg | ORAL_TABLET | Freq: Once | ORAL | Status: AC
  Administered 2018-12-25: 100 mg via ORAL
  Filled 2018-12-25: qty 1

## 2018-12-25 MED ORDER — OXYCODONE-ACETAMINOPHEN 5-325 MG PO TABS
1.0000 | ORAL_TABLET | Freq: Once | ORAL | Status: AC
  Administered 2018-12-25: 1 via ORAL
  Filled 2018-12-25: qty 1

## 2018-12-25 MED ORDER — METRONIDAZOLE 500 MG PO TABS
500.0000 mg | ORAL_TABLET | Freq: Once | ORAL | Status: AC
  Administered 2018-12-25: 500 mg via ORAL
  Filled 2018-12-25: qty 1

## 2018-12-25 MED ORDER — DOXYCYCLINE HYCLATE 100 MG PO CAPS: 100.0000 mg | ORAL_CAPSULE | Freq: Two times a day (BID) | ORAL | 0 refills | Status: AC

## 2018-12-25 MED ORDER — METRONIDAZOLE 500 MG PO TABS: 500.0000 mg | ORAL_TABLET | Freq: Three times a day (TID) | ORAL | 0 refills | Status: AC
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
## 2018-12-25 NOTE — ED Notes (Signed)
Patient AAOX4. Vitals Stable. NAD. 

## 2018-12-25 NOTE — Discharge Instructions (Addendum)
Keep your hand clean and dry. Follow up with our department in 24 hours for wound re-evaluation or at anytime if you noticed swelling, pus, redness or warmth of the skin surrounding the bite, or fever. Take the antibiotics fully as prescribed.

## 2021-11-25 ENCOUNTER — Other Ambulatory Visit: Payer: Self-pay

## 2021-11-25 ENCOUNTER — Encounter (HOSPITAL_COMMUNITY): Payer: Self-pay | Admitting: Emergency Medicine

## 2021-11-25 ENCOUNTER — Emergency Department (HOSPITAL_COMMUNITY)
Admission: EM | Admit: 2021-11-25 | Discharge: 2021-11-25 | Disposition: A | Discharge status: Home / Self Care | Payer: Self-pay | Attending: Emergency Medicine | Admitting: Emergency Medicine

## 2021-11-25 ENCOUNTER — Emergency Department (HOSPITAL_COMMUNITY): Payer: Self-pay

## 2021-11-25 DIAGNOSIS — M79632 Pain in left forearm: Secondary | ICD-10-CM | POA: Insufficient documentation

## 2021-11-25 DIAGNOSIS — S4992XA Unspecified injury of left shoulder and upper arm, initial encounter: Secondary | ICD-10-CM | POA: Insufficient documentation

## 2021-11-25 DIAGNOSIS — Y92039 Unspecified place in apartment as the place of occurrence of the external cause: Secondary | ICD-10-CM | POA: Insufficient documentation

## 2021-11-25 DIAGNOSIS — M25522 Pain in left elbow: Secondary | ICD-10-CM | POA: Insufficient documentation
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
## 2021-11-25 NOTE — Discharge Instructions (Signed)
You may use ibuprofen and Tylenol for your pain.  Ice your arm for any swelling.

## 2022-03-07 ENCOUNTER — Other Ambulatory Visit: Payer: Self-pay

## 2022-03-07 ENCOUNTER — Encounter (HOSPITAL_COMMUNITY): Payer: Self-pay

## 2022-03-07 ENCOUNTER — Emergency Department (HOSPITAL_COMMUNITY)
Admission: EM | Admit: 2022-03-07 | Discharge: 2022-03-08 | Disposition: A | Discharge status: Home / Self Care | Payer: Self-pay | Attending: Emergency Medicine | Admitting: Emergency Medicine

## 2022-03-07 DIAGNOSIS — Z5900 Homelessness unspecified: Secondary | ICD-10-CM | POA: Insufficient documentation

## 2022-03-07 DIAGNOSIS — L02212 Cutaneous abscess of back [any part, except buttock]: Secondary | ICD-10-CM | POA: Insufficient documentation

## 2022-03-07 DIAGNOSIS — R079 Chest pain, unspecified: Secondary | ICD-10-CM | POA: Insufficient documentation

## 2022-03-07 DIAGNOSIS — L0291 Cutaneous abscess, unspecified: Secondary | ICD-10-CM

## 2022-03-07 NOTE — ED Triage Notes (Signed)
Patient said he got bit by a spider 3 days ago on the left lower back. Pus coming out of bite, redness surrounding area. He said his wife popped it. He said he is homeless and felt the bite.

## 2022-03-08 MED ORDER — SULFAMETHOXAZOLE-TRIMETHOPRIM 800-160 MG PO TABS: 1.0000 | ORAL_TABLET | Freq: Two times a day (BID) | ORAL | 0 refills | Status: AC

## 2022-03-08 MED ORDER — SULFAMETHOXAZOLE-TRIMETHOPRIM 800-160 MG PO TABS
1.0000 | ORAL_TABLET | Freq: Once | ORAL | Status: AC
  Administered 2022-03-08: 1 via ORAL
  Filled 2022-03-08: qty 1

## 2022-03-08 MED ORDER — OXYCODONE-ACETAMINOPHEN 5-325 MG PO TABS
2.0000 | ORAL_TABLET | Freq: Once | ORAL | Status: AC
  Administered 2022-03-08: 2 via ORAL
  Filled 2022-03-08: qty 2

## 2022-03-08 MED ORDER — CEPHALEXIN 500 MG PO CAPS: 500.0000 mg | ORAL_CAPSULE | Freq: Four times a day (QID) | ORAL | 0 refills | Status: AC

## 2022-03-08 MED ORDER — CEFTRIAXONE SODIUM 1 G IJ SOLR
1.0000 g | Freq: Once | INTRAMUSCULAR | Status: AC
  Administered 2022-03-08: 1 g via INTRAMUSCULAR
  Filled 2022-03-08: qty 10

## 2022-03-08 MED ORDER — LIDOCAINE HCL (PF) 1 % IJ SOLN
5.0000 mL | Freq: Once | INTRAMUSCULAR | Status: DC
  Filled 2022-03-08: qty 30

## 2022-03-08 NOTE — ED Provider Notes (Signed)
  [facility]-EMERGENCY DEPT Provider Note   CSN: [id number] Arrival date & time: 03/07/22  2325     History  Chief Complaint  Patient presents with   Insect Bite    [name] is a 56 y.o. male.  Patient is a 56 year old male with past medical history of hyperlipidemia, seizures, and homelessness.  Patient presenting today with complaints of pain, redness, and swelling to his left lower back.  He reports being bitten by an insect several days ago.  The swelling has increased since.  He denies any fevers or chills.  He denies any aggravating or alleviating factors.  It has been draining pus intermittently for the past 2 days.  The history is provided by the patient.       Home Medications Prior to Admission medications   Not on File      Allergies    Penicillins    Review of Systems   Review of Systems  All other systems reviewed and are negative.   Physical Exam Updated Vital Signs BP (!) 181/105   Pulse 96   Temp 99.1 F (37.3 C) (Oral)   Resp 18   SpO2 100%  Physical Exam Vitals and nursing note reviewed.  Constitutional:      Appearance: Normal appearance.  HENT:     Head: Normocephalic and atraumatic.  Pulmonary:     Effort: Pulmonary effort is normal.  Skin:    General: Skin is warm and dry.     Comments: To the left lumbar region, there is an area of induration swelling, warmth, and erythema.  There is a central area of drainage.  The area is exquisitely tender to palpation.  Neurological:     Mental Status: He is alert.     ED Results / Procedures / Treatments   Labs (all labs ordered are listed, but only abnormal results are displayed) Labs Reviewed - No data to display  EKG None  Radiology No results found.  Procedures Procedures    Medications Ordered in ED Medications  cefTRIAXone (ROCEPHIN) injection 1 g (has no administration in time range)  sulfamethoxazole-trimethoprim (BACTRIM DS) 800-160 MG per  tablet 1 tablet (has no administration in time range)  lidocaine (PF) (XYLOCAINE) 1 % injection 5 mL (has no administration in time range)    ED Course/ Medical Decision Making/ A&P  Patient presenting with complaints of an abscess to the left lower back patient feels as related to an insect bite.  The area was incised and drained as below.  Patient given IM ceftriaxone and Bactrim.  To be discharged with Keflex and Bactrim, warm compresses, and return as needed.  INCISION AND DRAINAGE Performed by: Geoffery Lyons Consent: Verbal consent obtained. Risks and benefits: risks, benefits and alternatives were discussed Type: abscess  Body area: left lower back  Anesthesia: local infiltration  Incision was made with a scalpel.  Local anesthetic: lidocaine 1% without epinephrine  Anesthetic total: 4 ml  Complexity: complex Blunt dissection to break up loculations  Drainage: purulent  Drainage amount: Moderate  Packing material: No packing placed  Patient tolerance: Patient tolerated the procedure well with no immediate complications.    Final Clinical Impression(s) / ED Diagnoses Final diagnoses:  None    Rx / DC Orders ED Discharge Orders     None         Geoffery Lyons, MD 03/08/22 [phone]

## 2022-03-08 NOTE — Discharge Instructions (Addendum)
Begin taking Keflex and Bactrim as prescribed.  Warm compresses as frequently as possible for the next several days.  Return to the emergency department if symptoms significantly worsen or change.
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
